# Patient Record
Sex: Male | Born: 1960 | Race: Asian | Hispanic: No | Marital: Single
Health system: Southern US, Community
[De-identification: ages and names within clinical notes are randomized; demographics above are authoritative.]

---

## 2000-03-30 ENCOUNTER — Ambulatory Visit (HOSPITAL_COMMUNITY): Admission: RE | Admit: 2000-03-30 | Discharge: 2000-03-30 | Payer: Self-pay | Admitting: Family Medicine

## 2017-06-01 ENCOUNTER — Ambulatory Visit
Admission: RE | Admit: 2017-06-01 | Discharge: 2017-06-01 | Disposition: A | Discharge status: Home / Self Care | Payer: BLUE CROSS/BLUE SHIELD | Source: Ambulatory Visit | Attending: Family Medicine | Admitting: Family Medicine

## 2017-06-01 ENCOUNTER — Other Ambulatory Visit: Payer: Self-pay | Admitting: Family Medicine

## 2017-06-01 DIAGNOSIS — M545 Low back pain, unspecified: Secondary | ICD-10-CM

## 2020-04-12 DIAGNOSIS — Z0000 Encounter for general adult medical examination without abnormal findings: Secondary | ICD-10-CM | POA: Diagnosis not present

## 2020-04-12 DIAGNOSIS — Z13220 Encounter for screening for lipoid disorders: Secondary | ICD-10-CM | POA: Diagnosis not present

## 2020-04-12 DIAGNOSIS — Z125 Encounter for screening for malignant neoplasm of prostate: Secondary | ICD-10-CM | POA: Diagnosis not present

## 2020-06-01 DIAGNOSIS — Z9189 Other specified personal risk factors, not elsewhere classified: Secondary | ICD-10-CM | POA: Diagnosis not present

## 2020-06-01 DIAGNOSIS — R509 Fever, unspecified: Secondary | ICD-10-CM | POA: Diagnosis not present

## 2020-06-08 DIAGNOSIS — R0902 Hypoxemia: Secondary | ICD-10-CM | POA: Diagnosis not present

## 2020-06-08 DIAGNOSIS — U071 COVID-19: Secondary | ICD-10-CM | POA: Diagnosis not present

## 2020-06-09 ENCOUNTER — Encounter (HOSPITAL_COMMUNITY): Payer: Self-pay

## 2020-06-09 ENCOUNTER — Other Ambulatory Visit: Payer: Self-pay

## 2020-06-09 ENCOUNTER — Emergency Department (HOSPITAL_COMMUNITY)
Admission: EM | Admit: 2020-06-09 | Discharge: 2020-06-14 | Disposition: A | Discharge status: Home / Self Care | Payer: BC Managed Care – PPO | Attending: Emergency Medicine | Admitting: Emergency Medicine

## 2020-06-09 ENCOUNTER — Emergency Department (HOSPITAL_COMMUNITY): Payer: BC Managed Care – PPO

## 2020-06-09 DIAGNOSIS — U071 COVID-19: Secondary | ICD-10-CM

## 2020-06-09 DIAGNOSIS — R0602 Shortness of breath: Secondary | ICD-10-CM | POA: Diagnosis not present

## 2020-06-09 DIAGNOSIS — R918 Other nonspecific abnormal finding of lung field: Secondary | ICD-10-CM | POA: Diagnosis not present

## 2020-06-09 DIAGNOSIS — Z9222 Personal history of monoclonal drug therapy: Secondary | ICD-10-CM | POA: Insufficient documentation

## 2020-06-09 LAB — CBC WITH DIFFERENTIAL/PLATELET
Abs Immature Granulocytes: 0.03 10*3/uL (ref 0.00–0.07)
Basophils Absolute: 0 10*3/uL (ref 0.0–0.1)
Basophils Relative: 0 %
Eosinophils Absolute: 0 10*3/uL (ref 0.0–0.5)
Eosinophils Relative: 0 %
HCT: 42.4 % (ref 39.0–52.0)
Hemoglobin: 14.4 g/dL (ref 13.0–17.0)
Immature Granulocytes: 0 %
Lymphocytes Relative: 10 %
Lymphs Abs: 0.7 10*3/uL (ref 0.7–4.0)
MCH: 31.4 pg (ref 26.0–34.0)
MCHC: 34 g/dL (ref 30.0–36.0)
MCV: 92.6 fL (ref 80.0–100.0)
Monocytes Absolute: 0.7 10*3/uL (ref 0.1–1.0)
Monocytes Relative: 10 %
Neutro Abs: 6 10*3/uL (ref 1.7–7.7)
Neutrophils Relative %: 80 %
Platelets: 211 10*3/uL (ref 150–400)
RBC: 4.58 MIL/uL (ref 4.22–5.81)
RDW: 11.8 % (ref 11.5–15.5)
WBC: 7.4 10*3/uL (ref 4.0–10.5)
nRBC: 0 % (ref 0.0–0.2)

## 2020-06-09 LAB — COMPREHENSIVE METABOLIC PANEL
ALT: 57 U/L — ABNORMAL HIGH (ref 0–44)
AST: 38 U/L (ref 15–41)
Albumin: 3.2 g/dL — ABNORMAL LOW (ref 3.5–5.0)
Alkaline Phosphatase: 51 U/L (ref 38–126)
Anion gap: 18 — ABNORMAL HIGH (ref 5–15)
BUN: 16 mg/dL (ref 6–20)
CO2: 17 mmol/L — ABNORMAL LOW (ref 22–32)
Calcium: 9 mg/dL (ref 8.9–10.3)
Chloride: 103 mmol/L (ref 98–111)
Creatinine, Ser: 0.9 mg/dL (ref 0.61–1.24)
GFR calc Af Amer: >60 mL/min (ref >60)
GFR calc non Af Amer: >60 mL/min (ref >60)
Glucose, Bld: 128 mg/dL — ABNORMAL HIGH (ref 70–99)
Potassium: 3.7 mmol/L (ref 3.5–5.1)
Sodium: 138 mmol/L (ref 135–145)
Total Bilirubin: 1.5 mg/dL — ABNORMAL HIGH (ref 0.3–1.2)
Total Protein: 7.1 g/dL (ref 6.5–8.1)

## 2020-06-09 LAB — LACTIC ACID, PLASMA: Lactic Acid, Venous: 1.1 mmol/L (ref 0.5–1.9)

## 2020-06-09 NOTE — ED Notes (Signed)
Did not respond for vitals 

## 2020-06-09 NOTE — ED Triage Notes (Signed)
Patient diagnosed with covid 1 week ago. Reports that he is here requesting infusion since his oxygen dropped to 90. Had oxygen delivered to home last night and sats high 90s on 2 l. Complains of ongoing back pain

## 2020-06-10 MED ORDER — ALBUTEROL SULFATE HFA 108 (90 BASE) MCG/ACT IN AERS: 8.0000 | INHALATION_SPRAY | Freq: Once | RESPIRATORY_TRACT | Status: DC

## 2020-06-10 MED ORDER — DEXAMETHASONE 6 MG PO TABS: 6.0000 mg | ORAL_TABLET | Freq: Every day | ORAL | 0 refills | Status: AC

## 2020-06-10 MED ORDER — METHYLPREDNISOLONE SODIUM SUCC 125 MG IJ SOLR: 125.0000 mg | Freq: Once | INTRAMUSCULAR | Status: DC | PRN

## 2020-06-10 MED ORDER — SODIUM CHLORIDE 0.9 % IV SOLN: INTRAVENOUS | Status: DC | PRN

## 2020-06-10 MED ORDER — DIPHENHYDRAMINE HCL 50 MG/ML IJ SOLN: 50.0000 mg | Freq: Once | INTRAMUSCULAR | Status: DC | PRN

## 2020-06-10 MED ORDER — SODIUM CHLORIDE 0.9 % IV SOLN
Freq: Once | INTRAVENOUS | Status: AC
  Filled 2020-06-10: qty 20

## 2020-06-10 MED ORDER — EPINEPHRINE 0.3 MG/0.3ML IJ SOAJ: 0.3000 mg | Freq: Once | INTRAMUSCULAR | Status: DC | PRN

## 2020-06-10 MED ORDER — FAMOTIDINE IN NACL 20-0.9 MG/50ML-% IV SOLN: 20.0000 mg | Freq: Once | INTRAVENOUS | Status: DC | PRN

## 2020-06-10 MED ORDER — SODIUM CHLORIDE 0.9 % IV SOLN: 1200.0000 mg | Freq: Once | INTRAVENOUS | Status: DC

## 2020-06-10 MED ORDER — ALBUTEROL SULFATE HFA 108 (90 BASE) MCG/ACT IN AERS: 2.0000 | INHALATION_SPRAY | Freq: Once | RESPIRATORY_TRACT | Status: DC | PRN

## 2020-06-10 MED ORDER — AEROCHAMBER PLUS FLO-VU LARGE MISC: 1.0000 | Freq: Once | Status: DC

## 2020-06-10 NOTE — ED Provider Notes (Signed)
Nathan Oliver is a 59 y.o. male, patient with no diagnosed past medical history, presenting to the ED with    HPI from Frederik Pear, PA-C: "Nathan Oliver is a 59 y.o. male with no chronic medical conditions who presents to the emergency department with a chief complaint of " I need the monoclonal antibody infusion."  The patient reports that he received the [employer] COVID-19 vaccine on December 19, 2019.   Approximately 9 days ago, patient began developing URI symptoms.  He tested positive for COVID-19 one week ago.  He reports that he has had shortness of breath and cough.  Primary care set the patient up with home oxygen last night.  He reports that he has been wearing approximately 2 to 3 L at home.  He has been monitoring his oxygen levels with a pulse oximeter and at rest is oxygen saturation has been around 90%, but he has seen his oxygen saturation decreased to 85% with exertion.  Over the last 24 hours, his shortness of breath has worsened.  He was advised by primary care to come to the emergency department for monoclonal antibody infusion.  He denies chest pain, leg swelling, fever, chills, abdominal pain, syncope, dizziness, lightheadedness, rash, or other associated symptoms.  He does report that he has not been eating and drinking as well over the last few days and urine has been slightly darker.  No diarrhea or vomiting."  History reviewed. No pertinent past medical history.  Physical Exam  BP (!) 134/94   Pulse 73   Temp 98.2 F (36.8 C) (Oral)   Resp 17   SpO2 96%   Physical Exam Vitals and nursing note reviewed.  Constitutional:      General: He is not in acute distress.    Appearance: He is well-developed. He is not diaphoretic.  HENT:     Head: Normocephalic and atraumatic.     Mouth/Throat:     Mouth: Mucous membranes are moist.     Pharynx: Oropharynx is clear.  Eyes:     Conjunctiva/sclera: Conjunctivae normal.  Cardiovascular:     Rate and Rhythm:  Normal rate and regular rhythm.     Pulses: Normal pulses.          Radial pulses are 2+ on the right side and 2+ on the left side.       Posterior tibial pulses are 2+ on the right side and 2+ on the left side.     Heart sounds: Normal heart sounds.     Comments: Tactile temperature in the extremities appropriate and equal bilaterally. Pulmonary:     Effort: Pulmonary effort is normal. No respiratory distress.     Breath sounds: Normal breath sounds.     Comments: No increased work of breathing.  Speaks in full sentences without difficulty. Abdominal:     Palpations: Abdomen is soft.     Tenderness: There is no abdominal tenderness. There is no guarding.  Musculoskeletal:     Cervical back: Neck supple.     Right lower leg: No edema.     Left lower leg: No edema.  Lymphadenopathy:     Cervical: No cervical adenopathy.  Skin:    General: Skin is warm and dry.  Neurological:     Mental Status: He is alert.  Psychiatric:        Mood and Affect: Mood and affect normal.        Speech: Speech normal.  Behavior: Behavior normal.     ED Course/Procedures     Procedures   Abnormal Labs Reviewed  COMPREHENSIVE METABOLIC PANEL - Abnormal; Notable for the following components:      Result Value   CO2 17 (*)    Glucose, Bld 128 (*)    Albumin 3.2 (*)    ALT 57 (*)    Total Bilirubin 1.5 (*)    Anion gap 18 (*)    All other components within normal limits    DG Chest 1 View  Result Date: 06/09/2020 CLINICAL DATA:  COVID EXAM: CHEST  1 VIEW COMPARISON:  None. FINDINGS: Hazy ground-glass infiltrates in the right upper lobe and left greater than right lung base. Normal heart size. No pneumothorax IMPRESSION: Hazy ground-glass infiltrates in the right upper lobe and left greater than right lung base, consistent with bilateral pneumonia Electronically Signed   By: Jasmine Pang M.D.   On: 06/09/2020 16:10    20-gauge IV placed in right AC by PA student, Sharol Harness, with my  direct supervision.  Positive flash.  Advanced and flushed without pain, swelling, or other signs of infiltration.    MDM   Clinical Course as of Jun 10 1014  Thu Jun 10, 2020  9892 Patient states he still feels well.  No additional symptoms.   [SJ]    Clinical Course User Index [SJ] Anselm Pancoast, PA-C   Patient care handoff report received from [facility], PA-C. Plan: Patient awaiting MAB infusion.  May be discharged after.   Patient had no complaints with me during his ED course.  He underwent his MAB infusion and subsequent observation period here in the ED without noted adverse effects. The patient was given instructions for home care as well as return precautions. Patient voices understanding of these instructions, accepts the plan, and is comfortable with discharge.  I reviewed and interpreted the patient's labs and radiological studies.       Anselm Pancoast, PA-C 06/10/20 1017    Rolan Bucco, MD 06/10/20 1443

## 2020-06-10 NOTE — Discharge Instructions (Addendum)
Thank you for allowing me to care for you today in the Emergency Department.   You were seen today for a monoclonal antibody infusion.  You did have labs that demonstrated some mild dehydration, which is consistent with the darker urine that you have been seeing.  It is important you continue to drink plenty of fluids while you have COVID-19 to avoid dehydration.  Take 1 tablet of dexamethasone daily for the next 10 days.  You can follow-up with the COVID-19 clinic for reevaluation if you have any concerns.  Continue your home medications as prescribed by your primary care team.  You should return to the emergency department if you develop significant difficulty breathing despite using your home oxygen, severe chest pain, if you pass out, if you become very sleepy and difficult to wake up, if you stop making urine, develop uncontrollable vomiting, or other new, concerning symptoms.

## 2020-06-10 NOTE — ED Notes (Signed)
Pt was given the information packet before administration of bamlanivimab was started. He verbally consented to this RN of his approval before pump was started. VS were obtained before infusion.

## 2020-06-10 NOTE — ED Notes (Signed)
Ambulated pt on 3 L [place], sats dropped down to 91%. Pt denies feeling SOB.

## 2020-06-10 NOTE — ED Provider Notes (Signed)
[facility] EMERGENCY DEPARTMENT Provider Note   CSN: [id number] Arrival date & time: 06/09/20  1325     History No chief complaint on file.   Nathan Oliver is a 59 y.o. male with no chronic medical conditions who presents to the emergency department with a chief complaint of " I need the monoclonal antibody infusion."  The patient reports that he received the [employer] COVID-19 vaccine on December 19, 2019.   Approximately 9 days ago, patient began developing URI symptoms.  He tested positive for COVID-19 one week ago.  He reports that he has had shortness of breath and cough.  Primary care set the patient up with home oxygen last night.  He reports that he has been wearing approximately 2 to 3 L at home.  He has been monitoring his oxygen levels with a pulse oximeter and at rest is oxygen saturation has been around 90%, but he has seen his oxygen saturation decreased to 85% with exertion.  Over the last 24 hours, his shortness of breath has worsened.  He was advised by primary care to come to the emergency department for monoclonal antibody infusion.  He denies chest pain, leg swelling, fever, chills, abdominal pain, syncope, dizziness, lightheadedness, rash, or other associated symptoms.  He does report that he has not been eating and drinking as well over the last few days and urine has been slightly darker.  No diarrhea or vomiting.  Nathan Oliver was evaluated in Emergency Department on 06/10/2020 for the symptoms described in the history of present illness. He was evaluated in the context of the global COVID-19 pandemic, which necessitated consideration that the patient might be at risk for infection with the SARS-CoV-2 virus that causes COVID-19. Institutional protocols and algorithms that pertain to the evaluation of patients at risk for COVID-19 are in a state of rapid change based on information released by regulatory bodies including the CDC and federal and state  organizations. These policies and algorithms were followed during the patient's care in the ED.   The history is provided by the patient and medical records. No language interpreter was used.       History reviewed. No pertinent past medical history.  There are no problems to display for this patient.   History reviewed. No pertinent surgical history.     No family history on file.  Social History   Tobacco Use   Smoking status: Not on file  Substance Use Topics   Alcohol use: Not on file   Drug use: Not on file    Home Medications Prior to Admission medications   Medication Sig Start Date End Date Taking? Authorizing Provider  dexamethasone (DECADRON) 6 MG tablet Take 1 tablet (6 mg total) by mouth daily for 10 days. 06/10/20 06/20/20  Andree Heeg A, PA-C    Allergies    Patient has no known allergies.  Review of Systems   Review of Systems  Constitutional: Negative for appetite change, chills and fever.  HENT: Negative for sore throat.   Eyes: Negative for visual disturbance.  Respiratory: Positive for cough and shortness of breath.   Cardiovascular: Negative for chest pain.  Gastrointestinal: Negative for abdominal pain, constipation, diarrhea and vomiting.  Genitourinary: Negative for dysuria.  Musculoskeletal: Negative for back pain, neck pain and neck stiffness.  Skin: Negative for rash.  Allergic/Immunologic: Negative for immunocompromised state.  Neurological: Negative for dizziness, seizures, syncope, weakness, numbness and headaches.  Psychiatric/Behavioral: Negative for confusion.  Physical Exam Updated Vital Signs BP (!) 142/91    Pulse 81    Temp 98.2 F (36.8 C) (Oral)    Resp 17    SpO2 96%   Physical Exam Vitals and nursing note reviewed.  Constitutional:      General: He is not in acute distress.    Appearance: He is well-developed. He is not ill-appearing, toxic-appearing or diaphoretic.     Comments: Nontoxic-appearing.  Nasal  cannula in place on 3 L.  HENT:     Head: Normocephalic.  Eyes:     Conjunctiva/sclera: Conjunctivae normal.  Cardiovascular:     Rate and Rhythm: Normal rate and regular rhythm.     Pulses: Normal pulses.     Heart sounds: Normal heart sounds. No murmur heard.  No friction rub. No gallop.   Pulmonary:     Effort: Pulmonary effort is normal.     Comments: Lungs are clear to auscultation bilaterally.  Good air movement throughout.  He has some mild conversational dyspnea, but no retractions or accessory muscle use.  He has intermittent mild tachypnea.  No crepitus noted to the chest wall. Chest:     Chest wall: No tenderness.  Abdominal:     General: There is no distension.     Palpations: Abdomen is soft. There is no mass.     Tenderness: There is no abdominal tenderness. There is no right CVA tenderness, left CVA tenderness, guarding or rebound.     Hernia: No hernia is present.  Musculoskeletal:     Cervical back: Neck supple.     Right lower leg: No edema.     Left lower leg: No edema.  Skin:    General: Skin is warm and dry.  Neurological:     Mental Status: He is alert.  Psychiatric:        Behavior: Behavior normal.     ED Results / Procedures / Treatments   Labs (all labs ordered are listed, but only abnormal results are displayed) Labs Reviewed  COMPREHENSIVE METABOLIC PANEL - Abnormal; Notable for the following components:      Result Value   CO2 17 (*)    Glucose, Bld 128 (*)    Albumin 3.2 (*)    ALT 57 (*)    Total Bilirubin 1.5 (*)    Anion gap 18 (*)    All other components within normal limits  LACTIC ACID, PLASMA  CBC WITH DIFFERENTIAL/PLATELET    EKG None  Radiology DG Chest 1 View  Result Date: 06/09/2020 CLINICAL DATA:  COVID EXAM: CHEST  1 VIEW COMPARISON:  None. FINDINGS: Hazy ground-glass infiltrates in the right upper lobe and left greater than right lung base. Normal heart size. No pneumothorax IMPRESSION: Hazy ground-glass infiltrates  in the right upper lobe and left greater than right lung base, consistent with bilateral pneumonia Electronically Signed   By: Jasmine PangKim  Fujinaga M.D.   On: 06/09/2020 16:10    Procedures Procedures (including critical care time)  Medications Ordered in ED Medications  0.9 %  sodium chloride infusion (has no administration in time range)  diphenhydrAMINE (BENADRYL) injection 50 mg (has no administration in time range)  famotidine (PEPCID) IVPB 20 mg premix (has no administration in time range)  methylPREDNISolone sodium succinate (SOLU-MEDROL) 125 mg/2 mL injection 125 mg (has no administration in time range)  albuterol (VENTOLIN HFA) 108 (90 Base) MCG/ACT inhaler 2 puff (has no administration in time range)  EPINEPHrine (EPI-PEN) injection 0.3 mg (has no administration in  time range)  bamlanivimab 700 mg, etesevimab 1,400 mg in sodium chloride 0.9 % 160 mL IVPB (has no administration in time range)    ED Course  I have reviewed the triage vital signs and the nursing notes.  Pertinent labs & imaging results that were available during my care of the patient were reviewed by me and considered in my medical decision making (see chart for details).    MDM Rules/Calculators/A&P                          59 year old male with no chronic medical conditions who was vaccinated against COVID-19 with the [employer] vaccine in April 2021 who presents to the emergency department for monoclonal antibody infusion.  Patient tested positive for COVID-19 on 9/21 after developing symptoms the day before.  He has been followed by his PCP who ordered home oxygen for the patient, which arrived yesterday.  He has been on 3 L at home, but noted that his pulse oximeter intermittently decreased to 85% with ambulation at home and he was feeling more short of breath over the last 24 hours.  Symptoms worsen, he was prompted by his PCP to come to the emergency department to receive a monoclonal antibody  infusion.  Vital signs are stable.  Patient is on 3 L nasal cannula.  He is in no acute distress.  He does have some mild tachypnea, but no accessory muscle use or retractions.  No respiratory distress.  Chest x-ray has been reviewed by me with bilateral infiltrates in the right upper and left upper lobes suspicious for viral pneumonia related to COVID-19.  Labs are notable for decreased bicarb, which I suspect is secondary to dehydration as patient notably has somewhat darker urine at bedside.  He denies decreased urinary output but does reports that he has not been eating and drinking as well over the last few days.  He was offered IV fluids, but declined at this time.  He has a mild elevation in ALT, likely secondary to viral process.  Lactate is normal.  CBC is unremarkable.  Patient has been waiting for more than 14 hours in the emergency department for monoclonal antibody infusion.  He qualifies for criteria based on high risk race and ethnicity.  Per Mab infusion criteria, patient should not be on oxygen as this is risk for poor outcome for more severe case of COVID-19 that may require hospitalization.  However, patient has already been established with home oxygen.  I also trialed the patient on room air and he did not desat at rest.  If needed, home oxygen can be removed for infusion and then reapplied after the infusion has completed, but I suspect that this guideline is more for qualification based on severity of disease.  However, given that patient has had a breakthrough case after being fully vaccinated and has home oxygenation, he does not warrant admission at this time.  Patient was further evaluated for worsening hypoxia by completing a 2-minute walk test on his home 3 L of oxygen and remained at 91% and denied shortness of breath and had no evidence of increased work of breathing during walk test.  Given the extraordinary amount of time that patient has waited for infusion, will order  monoclonal antibody infusion.  I also feel that the patient would benefit from oral dexamethasone given that he is requiring supplemental oxygen.  Will discharge with Rx for the same and provide the patient with  a follow-up to the post COVID-19 clinic.  Patient care transferred to PA Joy at the end of my shift to follow-up on the patient for discharge after he has completed monoclonal antibody infusion and postinfusion observation.. Patient presentation, ED course, and plan of care discussed with review of all pertinent labs and imaging. Please see his/her note for further details regarding further ED course and disposition.  Reference: (Question: Can a patient receive mAb therapy if oxygen therapy was delivered prior to the infusion? "Answer: Yes. While mAb is not authorized in patients who require oxygen therapy for COVID-19 or who require an increase in baseline flow rate due to COVID-19. This oxygen requirement must not be present during the infusion. Before or after the infusion, oxygen therapy is permissible". [email or web address])  Final Clinical Impression(s) / ED Diagnoses Final diagnoses:  COVID-19    Rx / DC Orders ED Discharge Orders         Ordered    dexamethasone (DECADRON) 6 MG tablet  Daily        06/10/20 0724           Barkley Boards, PA-C 06/10/20 0746    Mesner, Barbara Cower, MD 06/16/20 2343

## 2020-06-14 DIAGNOSIS — U071 COVID-19: Secondary | ICD-10-CM | POA: Diagnosis not present

## 2020-06-16 ENCOUNTER — Telehealth: Payer: Self-pay | Admitting: Family Medicine

## 2020-06-16 NOTE — Telephone Encounter (Signed)
Pt was called to make appt w/ pccc per referral from [facility]. Unable to lvm

## 2020-07-08 DIAGNOSIS — U071 COVID-19: Secondary | ICD-10-CM | POA: Diagnosis not present

## 2020-07-08 DIAGNOSIS — R0902 Hypoxemia: Secondary | ICD-10-CM | POA: Diagnosis not present

## 2020-08-08 DIAGNOSIS — R0902 Hypoxemia: Secondary | ICD-10-CM | POA: Diagnosis not present

## 2020-08-08 DIAGNOSIS — U071 COVID-19: Secondary | ICD-10-CM | POA: Diagnosis not present

## 2020-09-07 DIAGNOSIS — U071 COVID-19: Secondary | ICD-10-CM | POA: Diagnosis not present

## 2020-09-07 DIAGNOSIS — R0902 Hypoxemia: Secondary | ICD-10-CM | POA: Diagnosis not present

## 2020-10-08 DIAGNOSIS — U071 COVID-19: Secondary | ICD-10-CM | POA: Diagnosis not present

## 2020-10-08 DIAGNOSIS — R0902 Hypoxemia: Secondary | ICD-10-CM | POA: Diagnosis not present

## 2020-11-08 DIAGNOSIS — U071 COVID-19: Secondary | ICD-10-CM | POA: Diagnosis not present

## 2020-11-08 DIAGNOSIS — R0902 Hypoxemia: Secondary | ICD-10-CM | POA: Diagnosis not present

## 2020-12-06 DIAGNOSIS — R0902 Hypoxemia: Secondary | ICD-10-CM | POA: Diagnosis not present

## 2020-12-06 DIAGNOSIS — U071 COVID-19: Secondary | ICD-10-CM | POA: Diagnosis not present

## 2020-12-28 DIAGNOSIS — Z0189 Encounter for other specified special examinations: Secondary | ICD-10-CM | POA: Diagnosis not present

## 2021-02-11 IMAGING — CR DG CHEST 1V
1 series · 1 of 1 positions shown · non-contrast
Comparison: None.

CLINICAL DATA: COVID

EXAM:
CHEST  1 VIEW

[AP]
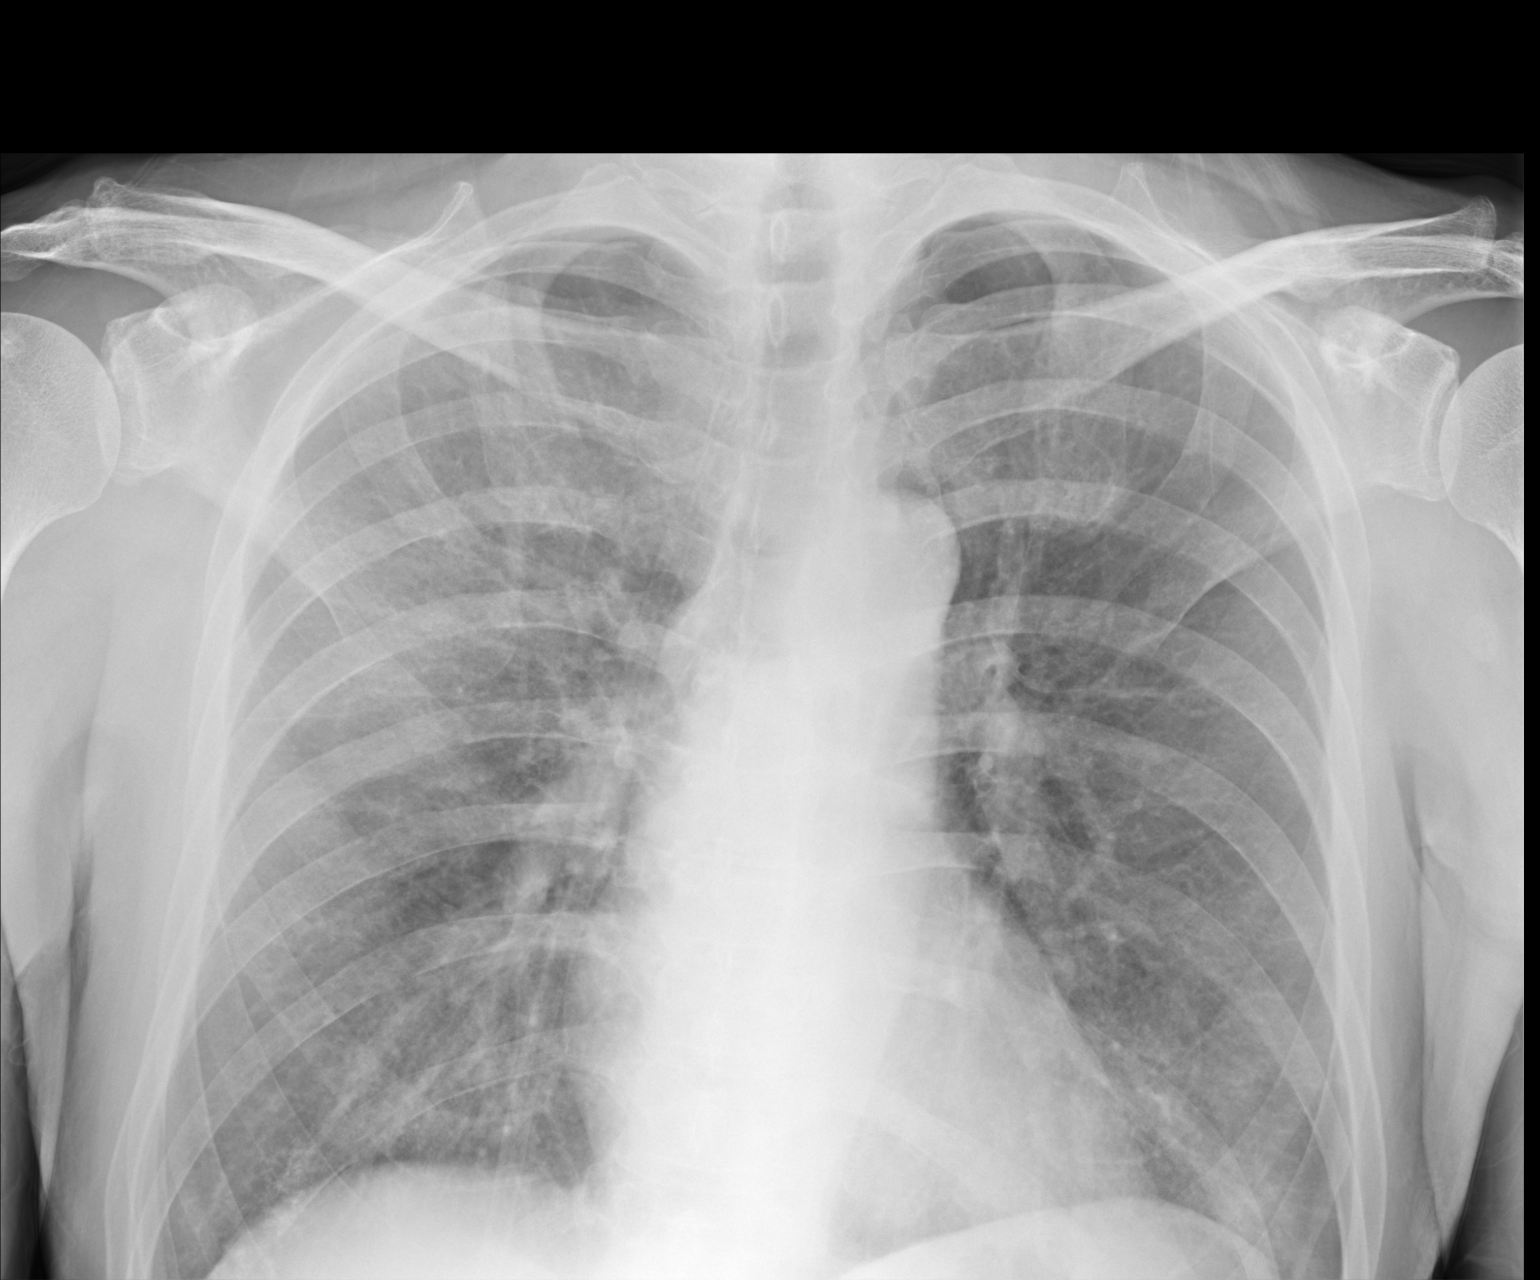

[1 of 1 positions shown; findings below may reference images not displayed]

FINDINGS: Hazy ground-glass infiltrates in the right upper lobe and left
greater than right lung base. Normal heart size. No pneumothorax
IMPRESSION: Hazy ground-glass infiltrates in the right upper lobe and left
greater than right lung base, consistent with bilateral pneumonia

## 2021-04-12 DIAGNOSIS — Z0000 Encounter for general adult medical examination without abnormal findings: Secondary | ICD-10-CM | POA: Diagnosis not present

## 2021-04-19 DIAGNOSIS — E559 Vitamin D deficiency, unspecified: Secondary | ICD-10-CM | POA: Diagnosis not present

## 2021-04-19 DIAGNOSIS — Z125 Encounter for screening for malignant neoplasm of prostate: Secondary | ICD-10-CM | POA: Diagnosis not present

## 2021-04-19 DIAGNOSIS — R7309 Other abnormal glucose: Secondary | ICD-10-CM | POA: Diagnosis not present

## 2021-04-19 DIAGNOSIS — R7989 Other specified abnormal findings of blood chemistry: Secondary | ICD-10-CM | POA: Diagnosis not present

## 2021-04-19 DIAGNOSIS — E291 Testicular hypofunction: Secondary | ICD-10-CM | POA: Diagnosis not present

## 2021-10-19 DIAGNOSIS — D225 Melanocytic nevi of trunk: Secondary | ICD-10-CM | POA: Diagnosis not present

## 2021-10-19 DIAGNOSIS — L814 Other melanin hyperpigmentation: Secondary | ICD-10-CM | POA: Diagnosis not present

## 2021-10-19 DIAGNOSIS — L821 Other seborrheic keratosis: Secondary | ICD-10-CM | POA: Diagnosis not present

## 2021-10-27 DIAGNOSIS — H5213 Myopia, bilateral: Secondary | ICD-10-CM | POA: Diagnosis not present

## 2021-10-27 DIAGNOSIS — H02834 Dermatochalasis of left upper eyelid: Secondary | ICD-10-CM | POA: Diagnosis not present

## 2021-10-27 DIAGNOSIS — H04123 Dry eye syndrome of bilateral lacrimal glands: Secondary | ICD-10-CM | POA: Diagnosis not present

## 2021-10-27 DIAGNOSIS — H02831 Dermatochalasis of right upper eyelid: Secondary | ICD-10-CM | POA: Diagnosis not present

## 2021-10-27 DIAGNOSIS — H2513 Age-related nuclear cataract, bilateral: Secondary | ICD-10-CM | POA: Diagnosis not present

## 2022-04-19 DIAGNOSIS — Z125 Encounter for screening for malignant neoplasm of prostate: Secondary | ICD-10-CM | POA: Diagnosis not present

## 2022-04-19 DIAGNOSIS — Z0000 Encounter for general adult medical examination without abnormal findings: Secondary | ICD-10-CM | POA: Diagnosis not present

## 2022-04-19 DIAGNOSIS — R7309 Other abnormal glucose: Secondary | ICD-10-CM | POA: Diagnosis not present

## 2022-04-19 DIAGNOSIS — Z79899 Other long term (current) drug therapy: Secondary | ICD-10-CM | POA: Diagnosis not present

## 2022-04-19 DIAGNOSIS — E559 Vitamin D deficiency, unspecified: Secondary | ICD-10-CM | POA: Diagnosis not present

## 2022-04-19 DIAGNOSIS — R7989 Other specified abnormal findings of blood chemistry: Secondary | ICD-10-CM | POA: Diagnosis not present

## 2022-04-19 DIAGNOSIS — Z13220 Encounter for screening for lipoid disorders: Secondary | ICD-10-CM | POA: Diagnosis not present

## 2022-05-03 DIAGNOSIS — D649 Anemia, unspecified: Secondary | ICD-10-CM | POA: Diagnosis not present

## 2022-07-26 DIAGNOSIS — Z86010 Personal history of colonic polyps: Secondary | ICD-10-CM | POA: Diagnosis not present

## 2022-08-09 DIAGNOSIS — Z86010 Personal history of colonic polyps: Secondary | ICD-10-CM | POA: Diagnosis not present

## 2022-10-19 DIAGNOSIS — L2389 Allergic contact dermatitis due to other agents: Secondary | ICD-10-CM | POA: Diagnosis not present

## 2022-10-19 DIAGNOSIS — L814 Other melanin hyperpigmentation: Secondary | ICD-10-CM | POA: Diagnosis not present

## 2022-10-19 DIAGNOSIS — L821 Other seborrheic keratosis: Secondary | ICD-10-CM | POA: Diagnosis not present

## 2022-10-19 DIAGNOSIS — D225 Melanocytic nevi of trunk: Secondary | ICD-10-CM | POA: Diagnosis not present

## 2023-04-24 DIAGNOSIS — E559 Vitamin D deficiency, unspecified: Secondary | ICD-10-CM | POA: Diagnosis not present

## 2023-04-24 DIAGNOSIS — Z125 Encounter for screening for malignant neoplasm of prostate: Secondary | ICD-10-CM | POA: Diagnosis not present

## 2023-04-24 DIAGNOSIS — R7309 Other abnormal glucose: Secondary | ICD-10-CM | POA: Diagnosis not present

## 2023-04-24 DIAGNOSIS — Z13220 Encounter for screening for lipoid disorders: Secondary | ICD-10-CM | POA: Diagnosis not present

## 2023-04-24 DIAGNOSIS — Z79899 Other long term (current) drug therapy: Secondary | ICD-10-CM | POA: Diagnosis not present

## 2023-04-24 DIAGNOSIS — R5383 Other fatigue: Secondary | ICD-10-CM | POA: Diagnosis not present

## 2023-04-24 DIAGNOSIS — Z0000 Encounter for general adult medical examination without abnormal findings: Secondary | ICD-10-CM | POA: Diagnosis not present

## 2023-04-25 DIAGNOSIS — M79674 Pain in right toe(s): Secondary | ICD-10-CM | POA: Diagnosis not present

## 2023-10-24 DIAGNOSIS — D225 Melanocytic nevi of trunk: Secondary | ICD-10-CM | POA: Diagnosis not present

## 2023-10-24 DIAGNOSIS — L814 Other melanin hyperpigmentation: Secondary | ICD-10-CM | POA: Diagnosis not present

## 2023-10-24 DIAGNOSIS — L821 Other seborrheic keratosis: Secondary | ICD-10-CM | POA: Diagnosis not present

## 2024-05-02 DIAGNOSIS — Z79899 Other long term (current) drug therapy: Secondary | ICD-10-CM | POA: Diagnosis not present

## 2024-05-02 DIAGNOSIS — Z13220 Encounter for screening for lipoid disorders: Secondary | ICD-10-CM | POA: Diagnosis not present

## 2024-05-02 DIAGNOSIS — R7989 Other specified abnormal findings of blood chemistry: Secondary | ICD-10-CM | POA: Diagnosis not present

## 2024-05-02 DIAGNOSIS — Z0000 Encounter for general adult medical examination without abnormal findings: Secondary | ICD-10-CM | POA: Diagnosis not present

## 2024-05-02 DIAGNOSIS — Z125 Encounter for screening for malignant neoplasm of prostate: Secondary | ICD-10-CM | POA: Diagnosis not present

## 2024-05-02 DIAGNOSIS — E291 Testicular hypofunction: Secondary | ICD-10-CM | POA: Diagnosis not present

## 2024-05-02 DIAGNOSIS — R7309 Other abnormal glucose: Secondary | ICD-10-CM | POA: Diagnosis not present

## 2024-05-02 DIAGNOSIS — Z23 Encounter for immunization: Secondary | ICD-10-CM | POA: Diagnosis not present

## 2024-05-02 DIAGNOSIS — E559 Vitamin D deficiency, unspecified: Secondary | ICD-10-CM | POA: Diagnosis not present
